# Patient Record
Sex: Female | Born: 1960 | Race: Black or African American | Hispanic: No | Marital: Married | Smoking: Former smoker
Health system: Southern US, Community
[De-identification: ages and names within clinical notes are randomized; demographics above are authoritative.]

## PROBLEM LIST (undated history)

## (undated) DIAGNOSIS — G459 Transient cerebral ischemic attack, unspecified: Secondary | ICD-10-CM

## (undated) DIAGNOSIS — E785 Hyperlipidemia, unspecified: Secondary | ICD-10-CM

## (undated) DIAGNOSIS — I10 Essential (primary) hypertension: Secondary | ICD-10-CM

---

## 2007-04-07 ENCOUNTER — Ambulatory Visit: Payer: Self-pay | Admitting: Internal Medicine

## 2007-12-03 ENCOUNTER — Ambulatory Visit: Payer: Self-pay | Admitting: Family Medicine

## 2010-02-08 ENCOUNTER — Ambulatory Visit: Payer: Self-pay | Admitting: Internal Medicine

## 2011-05-18 ENCOUNTER — Ambulatory Visit: Payer: Self-pay | Admitting: Family Medicine

## 2013-09-28 ENCOUNTER — Ambulatory Visit: Payer: Self-pay | Admitting: Internal Medicine

## 2015-05-24 IMAGING — CR DG KNEE COMPLETE 4+V*R*
4 series · 4 of 4 positions shown · non-contrast
Comparison: None.

CLINICAL DATA: Fall 2 weeks ago, difficulty walking

EXAM:
RIGHT KNEE - COMPLETE 4+ VIEW

[knee ap]
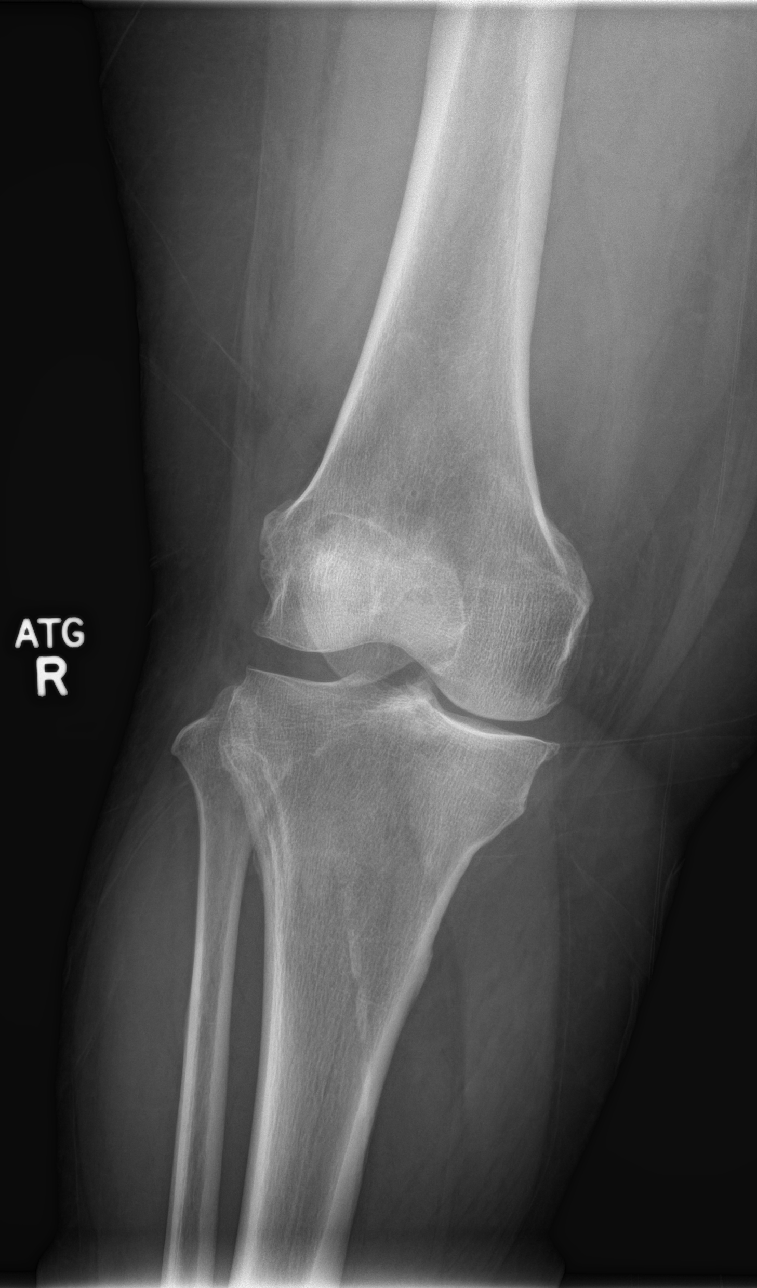

[knee obl (1 of 2)]
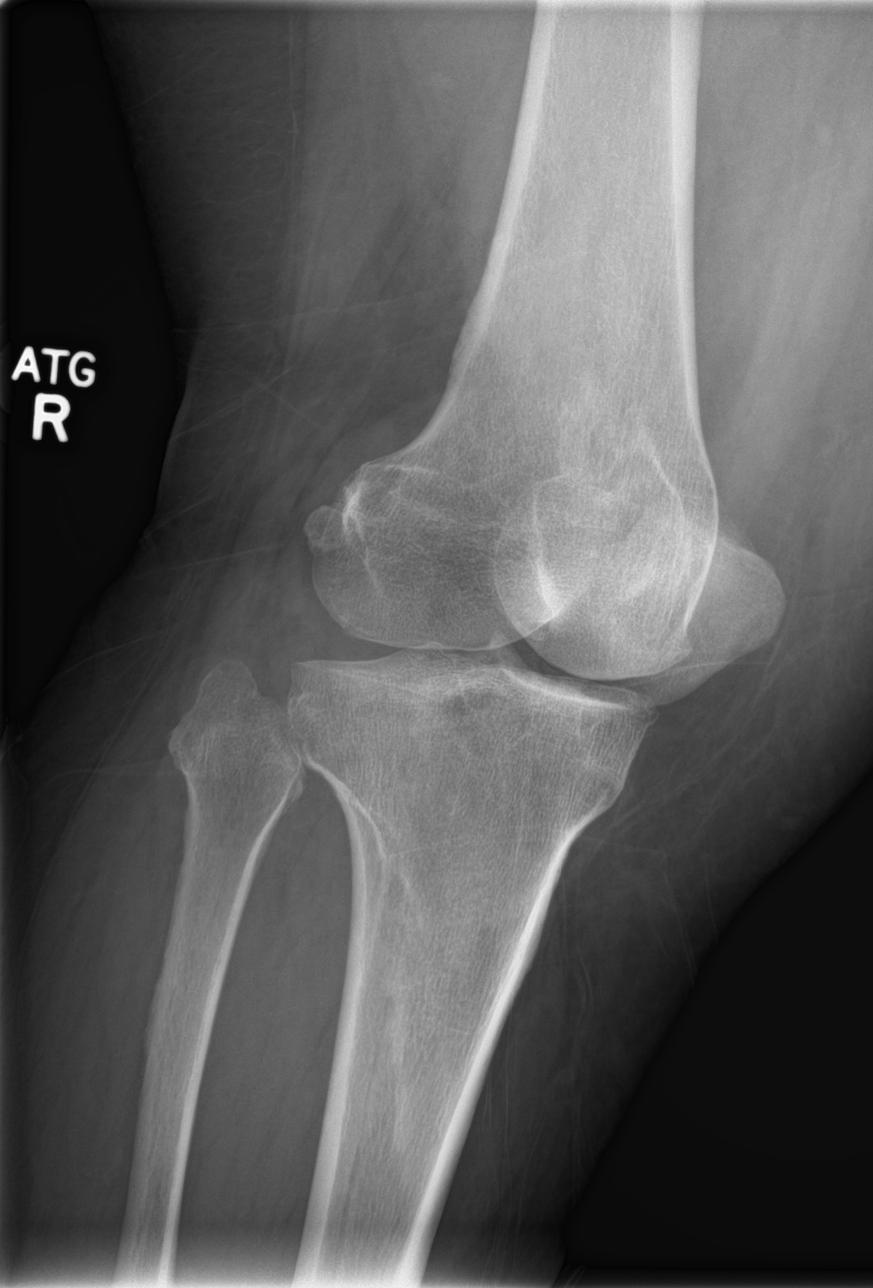

[knee obl (2 of 2)]
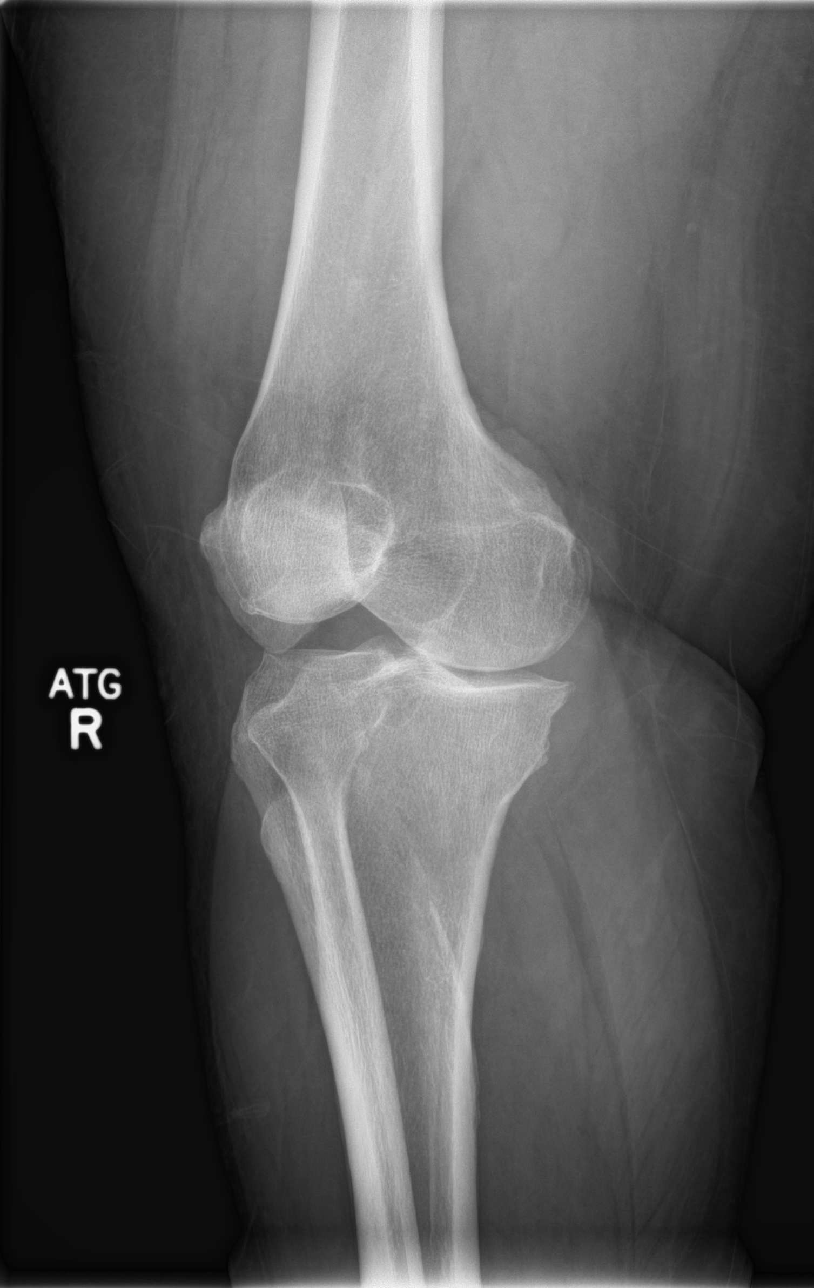

[knee lat]
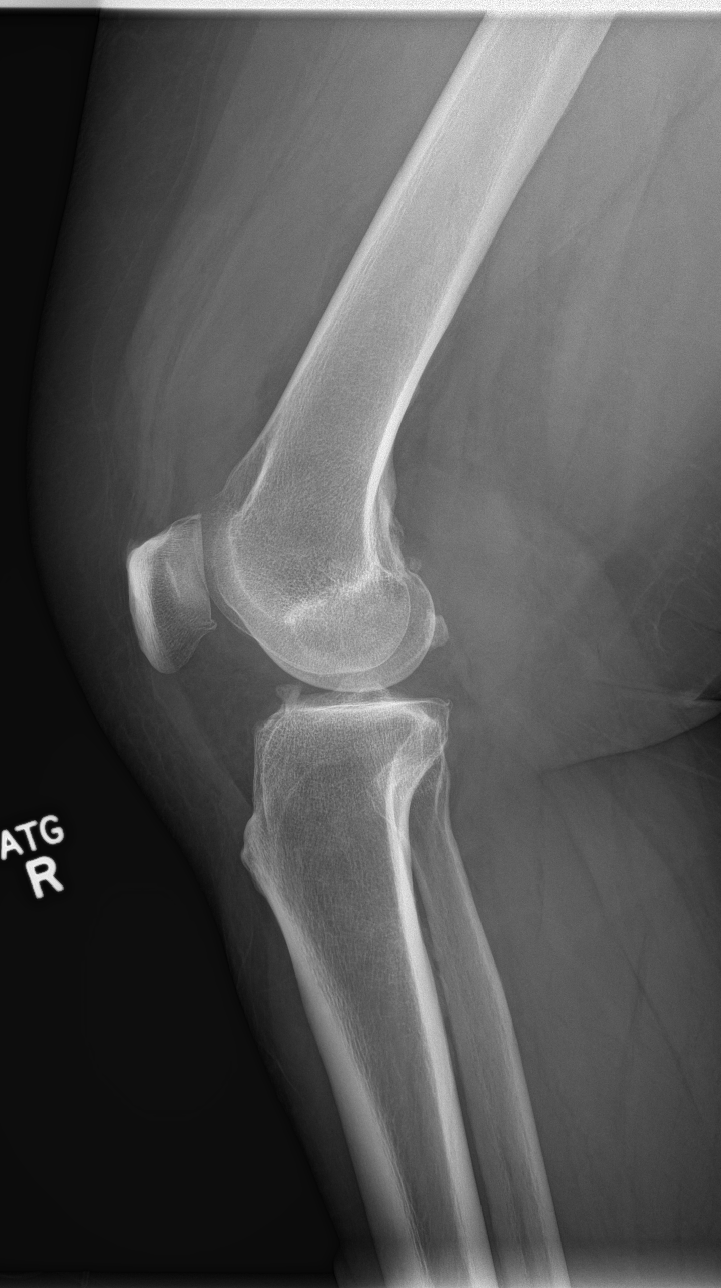

[4 of 4 positions shown; findings below may reference images not displayed]

FINDINGS: Four views of the right knee submitted. No acute fracture or
subluxation. There is narrowing of medial joint compartment. Mild
spurring of femoral condyles. Mild spurring of medial tibial
plateau. Narrowing of patellofemoral joint space. Moderate joint
effusion.
IMPRESSION: No acute fracture or subluxation. Osteoarthritic changes as
described above. Moderate joint effusion.

## 2015-06-14 ENCOUNTER — Other Ambulatory Visit: Payer: Self-pay

## 2016-08-26 ENCOUNTER — Other Ambulatory Visit: Payer: Self-pay | Admitting: Adult Health Nurse Practitioner

## 2016-11-17 ENCOUNTER — Encounter: Payer: Self-pay | Admitting: Emergency Medicine

## 2016-11-17 ENCOUNTER — Ambulatory Visit
Admission: EM | Admit: 2016-11-17 | Discharge: 2016-11-17 | Disposition: A | Discharge status: Home / Self Care | Payer: 59 | Attending: Family Medicine | Admitting: Family Medicine

## 2016-11-17 DIAGNOSIS — L84 Corns and callosities: Secondary | ICD-10-CM

## 2016-11-17 DIAGNOSIS — L03116 Cellulitis of left lower limb: Secondary | ICD-10-CM | POA: Diagnosis not present

## 2016-11-17 HISTORY — DX: Transient cerebral ischemic attack, unspecified: G45.9

## 2016-11-17 HISTORY — DX: Hyperlipidemia, unspecified: E78.5

## 2016-11-17 HISTORY — DX: Essential (primary) hypertension: I10

## 2016-11-17 MED ORDER — CEPHALEXIN 500 MG PO CAPS: 500.0000 mg | ORAL_CAPSULE | Freq: Three times a day (TID) | ORAL | 0 refills | Status: DC

## 2016-11-17 NOTE — ED Provider Notes (Signed)
[facility]    CSN: [id number] Arrival date & time: 11/17/16  1451     History   Chief Complaint Chief Complaint  Patient presents with  . sore on foot    left foot    HPI Michelle Marsh is a 56 y.o. female.   56 yo female with a c/o left foot sore and pain for the past 6 days. States has had a callus for a long time, however now more painful and redness plus slight swelling. States she has an appointment with podiatry later this week. Denies any fevers, chills.    The history is provided by the patient.    Past Medical History:  Diagnosis Date  . Hyperlipidemia   . Hypertension   . TIA (transient ischemic attack)     There are no active problems to display for this patient.   Past Surgical History:  Procedure Laterality Date  . CESAREAN SECTION      OB History    No data available       Home Medications    Prior to Admission medications   Medication Sig Start Date End Date Taking? Authorizing Provider  amLODipine (NORVASC) 10 MG tablet Take 10 mg by mouth daily.   Yes [provider]  atorvastatin (LIPITOR) 40 MG tablet Take 40 mg by mouth daily.   Yes [provider]  chlorthalidone (HYGROTON) 25 MG tablet Take 25 mg by mouth daily.   Yes [provider]  cephALEXin (KEFLEX) 500 MG capsule Take 1 capsule (500 mg total) by mouth 3 (three) times daily. 11/17/16   Michelle Mccallum, MD    Family History Family History  Problem Relation Age of Onset  . Cancer Mother 60       pancreatic  . Hypertension Mother   . Cancer Father        esophageal  . Hypertension Father     Social History Social History  Substance Use Topics  . Smoking status: Current Every Day Smoker    Types: Cigarettes  . Smokeless tobacco: Never Used  . Alcohol use Yes     Comment: socially     Allergies   Patient has no known allergies.   Review of Systems Review of Systems   Physical Exam Triage Vital Signs ED Triage Vitals    Enc Vitals Group     BP 11/17/16 1506 135/70     Pulse Rate 11/17/16 1506 96     Resp 11/17/16 1506 16     Temp 11/17/16 1506 98.2 F (36.8 C)     Temp Source 11/17/16 1506 Oral     SpO2 11/17/16 1506 99 %     Weight 11/17/16 1509 250 lb (113.4 kg)     Height 11/17/16 1509  (1.499 m)     Head Circumference --      Peak Flow --      Pain Score 11/17/16 1510 5     Pain Loc --      Pain Edu? --      Excl. in GC? --    No data found.   Updated Vital Signs BP 135/70 (BP Location: Left Arm)   Pulse 96   Temp 98.2 F (36.8 C) (Oral)   Resp 16   Ht  (1.499 m)   Wt 250 lb (113.4 kg)   SpO2 99%   BMI 50.49 kg/m   Visual Acuity Right Eye Distance:   Left Eye Distance:   Bilateral Distance:  Right Eye Near:   Left Eye Near:    Bilateral Near:     Physical Exam  Constitutional: She appears well-developed and well-nourished. No distress.  Musculoskeletal:       Left foot: There is tenderness (left lateral/plantar skin with thick callus formation and surrounding skin erythema, warmth and tenderness ). There is normal range of motion, no bony tenderness, no swelling, normal capillary refill, no deformity and no laceration.       Feet:  Skin: She is not diaphoretic.  Nursing note and vitals reviewed.    UC Treatments / Results  Labs (all labs ordered are listed, but only abnormal results are displayed) Labs Reviewed - No data to display  EKG  EKG Interpretation None       Radiology No results found.  Procedures Procedures (including critical care time)  Medications Ordered in UC Medications - No data to display   Initial Impression / Assessment and Plan / UC Course  I have reviewed the triage vital signs and the nursing notes.  Pertinent labs & imaging results that were available during my care of the patient were reviewed by me and considered in my medical decision making (see chart for details).       Final Clinical Impressions(s) /  UC Diagnoses   Final diagnoses:  Cellulitis of foot, left  Callus of foot    New Prescriptions New Prescriptions   CEPHALEXIN (KEFLEX) 500 MG CAPSULE    Take 1 capsule (500 mg total) by mouth 3 (three) times daily.   1. diagnosis reviewed with patient 2. rx as per orders above; reviewed possible side effects, interactions, risks and benefits  3. Recommend supportive treatment with elevation, warm compresses 4. Follow-up prn if symptoms worsen or don't improve  Controlled Substance Prescriptions [place] Controlled Substance Registry consulted? Not Applicable   Michelle Mccallum, MD 11/17/16 [phone]

## 2016-11-17 NOTE — ED Triage Notes (Signed)
Patient in today c/o sore on left foot x 3-4 months and worsening 2-3 weeks. Patient is not a diabetic.

## 2017-04-01 ENCOUNTER — Encounter: Payer: Self-pay | Admitting: Emergency Medicine

## 2017-04-01 ENCOUNTER — Ambulatory Visit
Admission: EM | Admit: 2017-04-01 | Discharge: 2017-04-01 | Disposition: A | Discharge status: Home / Self Care | Payer: 59 | Attending: Family Medicine | Admitting: Family Medicine

## 2017-04-01 ENCOUNTER — Other Ambulatory Visit: Payer: Self-pay

## 2017-04-01 DIAGNOSIS — J069 Acute upper respiratory infection, unspecified: Secondary | ICD-10-CM

## 2017-04-01 DIAGNOSIS — H6693 Otitis media, unspecified, bilateral: Secondary | ICD-10-CM

## 2017-04-01 DIAGNOSIS — J029 Acute pharyngitis, unspecified: Secondary | ICD-10-CM | POA: Diagnosis not present

## 2017-04-01 MED ORDER — AMOXICILLIN 875 MG PO TABS: 875.0000 mg | ORAL_TABLET | Freq: Two times a day (BID) | ORAL | 0 refills | Status: DC

## 2017-04-01 NOTE — ED Triage Notes (Signed)
Patient c/o bilateral ear pain and sinus pain and pressure for the past 2 days.

## 2017-04-01 NOTE — ED Provider Notes (Signed)
[facility] ____________________________________________  Time seen: Approximately 1:45 PM  I have reviewed the triage vital signs and the nursing notes.   HISTORY  Chief Complaint Otalgia and Sinus Problem   HPI Michelle Marsh is a 57 y.o. female presenting for evaluation of 2-3 days of runny nose, nasal congestion and bilateral ear pain.  States she believes her ears were bothering her even a few days prior.  States today she started to have a little bit of a cough.  States ear discomfort is moderate.  Denies any chills, body aches or feeling like she had a fever.  Has not taken any over-the-counter medications for the same complaints.  States that she has generalized sinus pressure and congestion sensation.  Continues to eat and drink well.  Denies chest pain or shortness of breath.  Denies recent sickness.  States hearing both her ears feel clogged.  Denies known sick contacts.  States also today mild sore throat.  Denies other aggravating or alleviating factors.  Denies chest pain, shortness of breath, abdominal pain, or rash. Denies recent sickness. Denies recent antibiotic use.   Etheleen Nicks, NP: PCP   Past Medical History:  Diagnosis Date  . Hyperlipidemia   . Hypertension   . TIA (transient ischemic attack)     There are no active problems to display for this patient.   Past Surgical History:  Procedure Laterality Date  . CESAREAN SECTION       No current facility-administered medications for this encounter.   Current Outpatient Medications:  .  amLODipine (NORVASC) 10 MG tablet, Take 10 mg by mouth daily., Disp: , Rfl:  .  atorvastatin (LIPITOR) 40 MG tablet, Take 40 mg by mouth daily., Disp: , Rfl:  .  chlorthalidone (HYGROTON) 25 MG tablet, Take 25 mg by mouth daily., Disp: , Rfl:  .  amoxicillin (AMOXIL) 875 MG tablet, Take 1 tablet (875 mg total) by mouth 2 (two) times daily., Disp: 20 tablet, Rfl: 0  Allergies Augmentin [amoxicillin-pot  clavulanate]  Family History  Problem Relation Age of Onset  . Cancer Mother 60       pancreatic  . Hypertension Mother   . Cancer Father        esophageal  . Hypertension Father     Social History Social History   Tobacco Use  . Smoking status: Current Every Day Smoker    Types: Cigarettes  . Smokeless tobacco: Never Used  Substance Use Topics  . Alcohol use: Yes    Comment: socially  . Drug use: No    Review of Systems Constitutional: No fever/chills Eyes: No visual changes. ENT: As above.  Cardiovascular: Denies chest pain. Respiratory: Denies shortness of breath. Gastrointestinal: No abdominal pain.  No nausea, no vomiting.  No diarrhea.  No constipation. Genitourinary: Negative for dysuria. Musculoskeletal: Negative for back pain. Skin: Negative for rash. Neurological: Negative for headaches, focal weakness or numbness.  10-point ROS otherwise negative.  ____________________________________________   PHYSICAL EXAM:  VITAL SIGNS: ED Triage Vitals  Enc Vitals Group     BP 04/01/17 1255 (!) 148/87     Pulse Rate 04/01/17 1255 100     Resp 04/01/17 1255 16     Temp 04/01/17 1255 98.6 F (37 C)     Temp Source 04/01/17 1255 Oral     SpO2 04/01/17 1255 96 %     Weight 04/01/17 1251 255 lb (115.7 kg)     Height 04/01/17 1251 4\' 11"  (1.499 m)  Head Circumference --      Peak Flow --      Pain Score 04/01/17 1251 8     Pain Loc --      Pain Edu? --      Excl. in GC? --    Constitutional: Alert and oriented. Well appearing and in no acute distress. Eyes: Conjunctivae are normal.  Head: Atraumatic.  Mild bilateral frontal and maxillary sinus tenderness palpation.  No swelling. No erythema.  Ears: Left: Mild tenderness auricle movement, normal canal, moderate erythema and bulging TM.  Right nontender to movement, normal canal, moderate erythema and dull TM.  No mastoid tenderness bilaterally.:   Nose:Nasal congestion with clear rhinorrhea  Mouth/Throat:  Mucous membranes are moist. Mild pharyngeal erythema. No tonsillar swelling or exudate.  Neck: No stridor.  No cervical spine tenderness to palpation. Hematological/Lymphatic/Immunilogical: No cervical lymphadenopathy. Cardiovascular: Normal rate, regular rhythm. Grossly normal heart sounds.  Good peripheral circulation. Respiratory: Normal respiratory effort.  No retractions. No wheezes, rales or rhonchi. Good air movement.  Musculoskeletal: Ambulatory with steady gait. No cervical, thoracic or lumbar tenderness to palpation. Neurologic:  Normal speech and language. No gait instability. Skin:  Skin appears warm, dry and intact. No rash noted. Psychiatric: Mood and affect are normal. Speech and behavior are normal.  ___________________________________________   LABS (all labs ordered are listed, but only abnormal results are displayed)  Labs Reviewed - No data to display   PROCEDURES Procedures    INITIAL IMPRESSION / ASSESSMENT AND PLAN / ED COURSE  Pertinent labs & imaging results that were available during my care of the patient were reviewed by me and considered in my medical decision making (see chart for details).  Well-appearing patient.  No acute distress.  Suspect viral upper respiratory infection.  Patient does note to have bilateral otitis media.  Will treat with oral amoxicillin.  Patient states that she is allergic to Augmentin, further clarifying as she cannot tolerate due to diarrhea, tolerates amoxicillin well in the past.  Encourage rest, fluids, supportive care, over-the-counter cough and decongestant medication such as Coricidin.Discussed indication, risks and benefits of medications with patient.  Discussed follow up with Primary care physician this week. Discussed follow up and return parameters including no resolution or any worsening concerns. Patient verbalized understanding and agreed to plan.   ____________________________________________   FINAL CLINICAL  IMPRESSION(S) / ED DIAGNOSES  Final diagnoses:  Bilateral otitis media, unspecified otitis media type  Upper respiratory tract infection, unspecified type     ED Discharge Orders        Ordered    amoxicillin (AMOXIL) 875 MG tablet  2 times daily     04/01/17 1338       Note: This dictation was prepared with Dragon dictation along with smaller phrase technology. Any transcriptional errors that result from this process are unintentional.         Renford DillsMiller, Lashaya Kienitz, NP 04/01/17 1354

## 2017-04-01 NOTE — Discharge Instructions (Signed)
Take medication as prescribed. Rest. Drink plenty of fluids.  ° °Follow up with your primary care physician this week as needed. Return to Urgent care for new or worsening concerns.  ° °

## 2019-05-13 ENCOUNTER — Ambulatory Visit: Payer: Self-pay | Attending: Internal Medicine

## 2019-05-13 DIAGNOSIS — Z23 Encounter for immunization: Secondary | ICD-10-CM

## 2019-05-13 NOTE — Progress Notes (Signed)
   Covid-19 Vaccination Clinic  Name:  Michelle Marsh    MRN: [id number] DOB: October 12, 1960  05/13/2019  [name] was observed post Covid-19 immunization for 15 minutes without incident. She was provided with Vaccine Information Sheet and instruction to access the V-Safe system.   [name] was instructed to call 911 with any severe reactions post vaccine: Marland Kitchen Difficulty breathing  . Swelling of face and throat  . A fast heartbeat  . A bad rash all over body  . Dizziness and weakness   Immunizations Administered    Name Date Dose VIS Date Route   Moderna COVID-19 Vaccine 05/13/2019  1:08 PM 0.5 mL 01/18/2019 Intramuscular   Manufacturer: Moderna   Lot: [id number]   NDC: 97471-855-01

## 2019-05-30 ENCOUNTER — Ambulatory Visit
Admission: EM | Admit: 2019-05-30 | Discharge: 2019-05-30 | Disposition: A | Discharge status: Home / Self Care | Payer: BC Managed Care – PPO | Attending: Urgent Care | Admitting: Urgent Care

## 2019-05-30 ENCOUNTER — Other Ambulatory Visit: Payer: Self-pay

## 2019-05-30 ENCOUNTER — Encounter: Payer: Self-pay | Admitting: Emergency Medicine

## 2019-05-30 DIAGNOSIS — M5442 Lumbago with sciatica, left side: Secondary | ICD-10-CM

## 2019-05-30 MED ORDER — HYDROCODONE-ACETAMINOPHEN 5-325 MG PO TABS: 1.0000 | ORAL_TABLET | Freq: Two times a day (BID) | ORAL | 0 refills | Status: DC | PRN

## 2019-05-30 MED ORDER — METHYLPREDNISOLONE 4 MG PO TBPK: ORAL_TABLET | ORAL | 0 refills | Status: DC

## 2019-05-30 NOTE — ED Provider Notes (Signed)
Michelle Marsh   Name: Michelle Marsh DOB: [birth year] MRN: [id number] CSN: [id number] PCP: Verita Lamb, NP  Arrival date and time:  05/30/19 1242  Chief Complaint:  Back Pain and Leg Pain (left)  NOTE: Prior to seeing the patient today, I have reviewed the triage nursing documentation and vital signs. Clinical staff has updated patient's PMH/PSHx, current medication list, and drug allergies/intolerances to ensure comprehensive history available to assist in medical decision making.   History:   HPI: Michelle Marsh is a 59 y.o. female who presents today with complaints of lower back pain that began 2-3 days ago. Pain has worsened since onset. She notes that her terminally ill husband suffered a fall and she tried to help get him up out of the floor. Pain is mainly in the LEFT side of her back. She has not experienced any concurrent urinary symptoms. Patient endorses radiation of her pain into her LEFT lower extremity culminating at the level of the posterior knee. Pain is described as a stabbing sensation. She has not appreciated any weakness or distal paraesthesias in her legs. Patient denies any acute issues with bowel or bladder incontinence. No saddle anesthesia. Patient notes that pain is exacerbated by ambulation, prolonged standing, and prolonged sitting. She has attempted conservative management at home using APAP, which has done little to improve her symptoms. PMH does not include any previous back injuries or surgeries.  Past Medical History:  Diagnosis Date  . Hyperlipidemia   . Hypertension   . TIA (transient ischemic attack)     Past Surgical History:  Procedure Laterality Date  . CESAREAN SECTION      Family History  Problem Relation Age of Onset  . Cancer Mother 39       pancreatic  . Hypertension Mother   . Cancer Father        esophageal  . Hypertension Father     Social History   Tobacco Use  . Smoking status: Former Smoker    Types:  Cigarettes  . Smokeless tobacco: Never Used  Substance Use Topics  . Alcohol use: Yes    Comment: socially  . Drug use: No    There are no problems to display for this patient.   Home Medications:    Current Meds  Medication Sig  . amLODipine (NORVASC) 10 MG tablet Take 10 mg by mouth daily.  Marland Kitchen atorvastatin (LIPITOR) 40 MG tablet Take 40 mg by mouth daily.  . chlorthalidone (HYGROTON) 25 MG tablet Take 25 mg by mouth daily.    Allergies:   Augmentin [amoxicillin-pot clavulanate]  Review of Systems (ROS):  Review of systems NEGATIVE unless otherwise noted in narrative H&P section.   Vital Signs: Today's Vitals   05/30/19 1252 05/30/19 1256  BP:  (!) 146/69  Pulse:  72  Resp:  18  Temp:  97.9 F (36.6 C)  TempSrc:  Oral  SpO2:  97%  Weight: 255 lb 1.2 oz (115.7 kg)   Height: 4\' 11"  (1.499 m)   PainSc: 8      Physical Exam: Physical Exam  Constitutional: She is oriented to person, place, and time and well-developed, well-nourished, and in no distress.  HENT:  Head: Normocephalic and atraumatic.  Eyes: Pupils are equal, round, and reactive to light.  Cardiovascular: Normal rate, regular rhythm, normal heart sounds and intact distal pulses.  Pulmonary/Chest: Effort normal and breath sounds normal.  Musculoskeletal:     Lumbar back: Pain and tenderness present. No edema or spasms. Normal  pulse.       Back:     Comments: No midline back pain or gross deformities.   Neurological: She is alert and oriented to person, place, and time. She has normal sensation, normal strength and normal reflexes. She has a normal Straight Leg Raise Test. Gait (2/2 acute pain; limping) abnormal.  Skin: Skin is warm and dry. No rash noted. She is not diaphoretic.  Psychiatric: Memory, affect and judgment normal. Her mood appears anxious.  Nursing note and vitals reviewed.   Urgent Care Treatments / Results:   No orders of the defined types were placed in this encounter.   LABS:  PLEASE NOTE: all labs that were ordered this encounter are listed, however only abnormal results are displayed. Labs Reviewed - No data to display  EKG: -None  RADIOLOGY: No results found.  PROCEDURES: Procedures  MEDICATIONS RECEIVED THIS VISIT: Medications - No data to display  PERTINENT CLINICAL COURSE NOTES/UPDATES:   Initial Impression / Assessment and Plan / Urgent Care Course:  Pertinent labs & imaging results that were available during my care of the patient were personally reviewed by me and considered in my medical decision making (see lab/imaging section of note for values and interpretations).  [name] is a 59 y.o. female who presents to [facility] today with complaints of Back Pain and Leg Pain (left)  Patient is well appearing overall in clinic today. She does not appear to be in any acute distress. Presenting symptoms (see HPI) and exam as documented above. Patient with tenderness overlying the LEFT paraspinous muscle group and SI joint. Pain radiates into the buttock and into the posterior aspect of her leg culminating at the level of the knee. Exam consistent with sciatica. Patient is in significant pain and reports that she is having difficulties sleeping at night. Will pursue treatment using systemic steroids (Medrol). She was educated on complimentary modalities to help with her pain. Patient encouraged to remain physically active and attempt to stretch as tolerated. Patient advised to avoid heavy lifting over the next few days. She  Was advised to apply moist heat TID-QID for at least 15-20 minutes at a time; written information provided on today's AVS. Will also send in a prescription for a short course supply of Norco 5/325 mg tablets for PRN use; indications and side effects reviewed.   Current clinical condition warrants patient being out of work in order to recover from her current injury/illness. She was provided with the appropriate  documentation to provide to her place of employment that will allow for her to RTW on 06/02/2019 with no restrictions.   Discussed follow up with primary care physician in 1 week for re-evaluation. I have reviewed the follow up and strict return precautions for any new or worsening symptoms. Patient is aware of symptoms that would be deemed urgent/emergent, and would thus require further evaluation either here or in the emergency department. At the time of discharge, she verbalized understanding and consent with the discharge plan as it was reviewed with her. All questions were fielded by provider and/or clinic staff prior to patient discharge.    Final Clinical Impressions / Urgent Care Diagnoses:   Final diagnoses:  Acute left-sided low back pain with left-sided sciatica    New Prescriptions:  [place] Controlled Substance Registry consulted? Yes, I have consulted the [place] Controlled Substances Registry for this patient, and feel the risk/benefit ratio today is favorable for proceeding with this prescription for a controlled substance.  . Discussed  use of controlled substance medication to treat her acute pain.  o Reviewed [place] STOP Act regulations  o Clinic does not refill controlled substances over the phone without face to face evaluation.  . Safety precautions reviewed.  o Medications should not be bitten, chewed, sold, or taken with alcohol.  o Avoid use while working, driving, or operating heavy machinery.  o Side effects associated with the use of this particular medication reviewed. - Patient understands that this medication can cause CNS depression, increase her risk of falls, and even lead to overdose that may result in death, if used outside of the parameters that she and I discussed.  With all of this in mind, she knowingly accepts the risks and responsibilities associated with intended course of treatment, and elects to responsibly proceed as discussed.  Meds ordered this encounter   Medications  . methylPREDNISolone (MEDROL DOSEPAK) 4 MG TBPK tablet    Sig: Take by mouth daily - taper daily dose per package instructions.    Dispense:  21 tablet    Refill:  0  . HYDROcodone-acetaminophen (NORCO) 5-325 MG tablet    Sig: Take 1 tablet by mouth 2 (two) times daily as needed for moderate pain.    Dispense:  8 tablet    Refill:  0    Recommended Follow up Care:  Patient encouraged to follow up with the following provider within the specified time frame, or sooner as dictated by the severity of her symptoms. As always, she was instructed that for any urgent/emergent care needs, she should seek care either here or in the emergency department for more immediate evaluation.  Follow-up Information    Etheleen Nicks, NP In 1 week.   Why: General reassessment of symptoms if not improving Contact information: [address] [place] [address] [phone]         NOTE: This note was prepared using Dragon dictation software along with smaller phrase technology. Despite my best ability to proofread, there is the potential that transcriptional errors may still occur from this process, and are completely unintentional.    Verlee Monte, NP 05/30/19 1342

## 2019-05-30 NOTE — ED Triage Notes (Signed)
Pt c/o left leg pain starting at her knee and radiates up into her lower back. Started about 2-3 days ago. She states that she had to help pick up her terminally ill husband after a fall  And has been hurting since.

## 2019-05-30 NOTE — Discharge Instructions (Addendum)
It was very nice seeing you today in clinic. Thank you for entrusting me with your care.   Make efforts to remain active. Stretch as tolerated. Use medications as prescribed. Apply moist heat 3-4 times a day for 15-10 minutes at a time. Avoid heavy lifting. Use pain medication as needed, but be careful as it can make you sleepy; no driving.   Make arrangements to follow up with your regular doctor in 1 week for re-evaluation if not improving.  If your symptoms/condition worsens, please seek follow up care either here or in the ER. Please remember, our [facility] providers are "right here with you" when you need [place].   Again, it was my pleasure to take care of you today. Thank you for choosing our clinic. I hope that you start to feel better quickly.   Quentin Mulling, MSN, APRN, FNP-C, CEN Advanced Practice Provider [facility]

## 2019-06-10 ENCOUNTER — Ambulatory Visit: Payer: BC Managed Care – PPO | Attending: Internal Medicine

## 2019-06-10 DIAGNOSIS — Z23 Encounter for immunization: Secondary | ICD-10-CM

## 2019-06-10 NOTE — Progress Notes (Signed)
   Covid-19 Vaccination Clinic  Name:  Michelle Marsh    MRN: [id number] DOB: 03-25-1960  06/10/2019  [name] was observed post Covid-19 immunization for 15 minutes without incident. She was provided with Vaccine Information Sheet and instruction to access the V-Safe system.   [name] was instructed to call 911 with any severe reactions post vaccine: Marland Kitchen Difficulty breathing  . Swelling of face and throat  . A fast heartbeat  . A bad rash all over body  . Dizziness and weakness   Immunizations Administered    Name Date Dose VIS Date Route   Moderna COVID-19 Vaccine 06/10/2019 12:03 PM 0.5 mL 01/2019 Intramuscular   Manufacturer: Moderna   Lot: [id number]   NDC: 00511-021-11

## 2022-01-23 ENCOUNTER — Ambulatory Visit
Admission: EM | Admit: 2022-01-23 | Discharge: 2022-01-23 | Disposition: A | Discharge status: Home / Self Care | Payer: BC Managed Care – PPO | Attending: Emergency Medicine | Admitting: Emergency Medicine

## 2022-01-23 ENCOUNTER — Ambulatory Visit (INDEPENDENT_AMBULATORY_CARE_PROVIDER_SITE_OTHER): Payer: BC Managed Care – PPO

## 2022-01-23 ENCOUNTER — Encounter: Payer: Self-pay | Admitting: Emergency Medicine

## 2022-01-23 DIAGNOSIS — R509 Fever, unspecified: Secondary | ICD-10-CM | POA: Diagnosis not present

## 2022-01-23 DIAGNOSIS — Z1152 Encounter for screening for COVID-19: Secondary | ICD-10-CM | POA: Diagnosis not present

## 2022-01-23 DIAGNOSIS — R062 Wheezing: Secondary | ICD-10-CM | POA: Diagnosis not present

## 2022-01-23 DIAGNOSIS — R059 Cough, unspecified: Secondary | ICD-10-CM | POA: Diagnosis not present

## 2022-01-23 DIAGNOSIS — J101 Influenza due to other identified influenza virus with other respiratory manifestations: Secondary | ICD-10-CM | POA: Insufficient documentation

## 2022-01-23 DIAGNOSIS — R531 Weakness: Secondary | ICD-10-CM

## 2022-01-23 LAB — RESP PANEL BY RT-PCR (FLU A&B, COVID) ARPGX2
Influenza A by PCR: POSITIVE — AB
Influenza B by PCR: NEGATIVE
SARS Coronavirus 2 by RT PCR: NEGATIVE

## 2022-01-23 MED ORDER — AEROCHAMBER MV MISC: 1 refills | Status: AC

## 2022-01-23 MED ORDER — ALBUTEROL SULFATE HFA 108 (90 BASE) MCG/ACT IN AERS: 1.0000 | INHALATION_SPRAY | RESPIRATORY_TRACT | 0 refills | Status: AC | PRN

## 2022-01-23 MED ORDER — ALBUTEROL SULFATE (2.5 MG/3ML) 0.083% IN NEBU
2.5000 mg | INHALATION_SOLUTION | Freq: Once | RESPIRATORY_TRACT | Status: AC
  Administered 2022-01-23: 2.5 mg via RESPIRATORY_TRACT

## 2022-01-23 MED ORDER — ACETAMINOPHEN 500 MG PO TABS
1000.0000 mg | ORAL_TABLET | Freq: Once | ORAL | Status: AC
  Administered 2022-01-23: 1000 mg via ORAL

## 2022-01-23 MED ORDER — IPRATROPIUM-ALBUTEROL 0.5-2.5 (3) MG/3ML IN SOLN
3.0000 mL | Freq: Once | RESPIRATORY_TRACT | Status: AC
  Administered 2022-01-23: 3 mL via RESPIRATORY_TRACT

## 2022-01-23 MED ORDER — OSELTAMIVIR PHOSPHATE 75 MG PO CAPS: 75.0000 mg | ORAL_CAPSULE | Freq: Two times a day (BID) | ORAL | 0 refills | Status: DC

## 2022-01-23 MED ORDER — IBUPROFEN 600 MG PO TABS: 600.0000 mg | ORAL_TABLET | Freq: Four times a day (QID) | ORAL | 0 refills | Status: AC | PRN

## 2022-01-23 MED ORDER — PROMETHAZINE-DM 6.25-15 MG/5ML PO SYRP: 5.0000 mL | ORAL_SOLUTION | Freq: Four times a day (QID) | ORAL | 0 refills | Status: DC | PRN

## 2022-01-23 NOTE — Discharge Instructions (Signed)
Take 600 mg of ibuprofen with 1000 mg Tylenol 3 times a day as needed for fever, body aches.  2 puffs from your albuterol inhaler using your spacer every 4 hours for 2 days, then every 6 hours for 2 days, then as needed.  You can back off the albuterol if you start to improve sooner.  Promethazine DM for cough.  Finish the Tamiflu, even if you feel better.  Get a pulse oximeter and watch your oxygen saturation.  If it goes consistently below 90%, go to the emergency department.

## 2022-01-23 NOTE — ED Provider Notes (Signed)
HPI  SUBJECTIVE:  Michelle Marsh is a 61 y.o. female who presents with a cough productive of phlegm, fevers Tmax 101.6, body aches, headaches, nasal congestion, rhinorrhea, frontal sinus pain and pressure, postnasal drip, wheezing, shortness of breath with walking and coughing.  No facial swelling, upper dental pain, loss of sense of smell or taste, nausea, vomiting, diarrhea, abdominal pain.  No known COVID, flu, RSV exposure, but she works at a daycare.  She had 3 doses of the COVID-vaccine and this years flu vaccine.  She was on doxycycline for cough in the past 3 months.  No antipyretic in the past 6 hours.  She tried increasing fluids without improvement in her symptoms.  Symptoms are worse with coughing, ambulation.  She states that she is an occasional smoker for the past 25 years, has a history of hypertension, hypercholesterolemia, TIA, bronchitis.  No history of pulmonary disease, diabetes, chronic kidney disease, CHF.  PCP: [facility]    Past Medical History:  Diagnosis Date   Hyperlipidemia    Hypertension    TIA (transient ischemic attack)     Past Surgical History:  Procedure Laterality Date   CESAREAN SECTION      Family History  Problem Relation Age of Onset   Cancer Mother 33       pancreatic   Hypertension Mother    Cancer Father        esophageal   Hypertension Father     Social History   Tobacco Use   Smoking status: Former    Types: Cigarettes   Smokeless tobacco: Never  Vaping Use   Vaping Use: Never used  Substance Use Topics   Alcohol use: Yes    Comment: socially   Drug use: No    No current facility-administered medications for this encounter.  Current Outpatient Medications:    albuterol (VENTOLIN HFA) 108 (90 Base) MCG/ACT inhaler, Inhale 1-2 puffs into the lungs every 4 (four) hours as needed for wheezing or shortness of breath., Disp: 1 each, Rfl: 0   amLODipine (NORVASC) 10 MG tablet, Take 10 mg by mouth daily., Disp: , Rfl:     aspirin EC 81 MG tablet, Take 1 tablet by mouth daily., Disp: , Rfl:    atorvastatin (LIPITOR) 40 MG tablet, Take 40 mg by mouth daily., Disp: , Rfl:    chlorthalidone (HYGROTON) 25 MG tablet, Take 25 mg by mouth daily., Disp: , Rfl:    ibuprofen (ADVIL) 600 MG tablet, Take 1 tablet (600 mg total) by mouth every 6 (six) hours as needed., Disp: 30 tablet, Rfl: 0   oseltamivir (TAMIFLU) 75 MG capsule, Take 1 capsule (75 mg total) by mouth 2 (two) times daily. X 5 days, Disp: 10 capsule, Rfl: 0   promethazine-dextromethorphan (PROMETHAZINE-DM) 6.25-15 MG/5ML syrup, Take 5 mLs by mouth 4 (four) times daily as needed for cough., Disp: 118 mL, Rfl: 0   Spacer/Aero-Holding Chambers (AEROCHAMBER MV) inhaler, Use as instructed, Disp: 1 each, Rfl: 1  Allergies  Allergen Reactions   Augmentin [Amoxicillin-Pot Clavulanate] Other (See Comments)   Meclizine Other (See Comments)    Memory loss, unable to get out of bed  Memory loss, unable to get out of bed  Memory loss, unable to get out of bed     ROS  As noted in HPI.   Physical Exam  BP (!) 156/79 (BP Location: Left Arm)   Pulse (!) 109   Temp 99.4 F (37.4 C)   Resp 20   Ht  4\' 11"  (1.499 m)   Wt 115.7 kg   SpO2 95%   BMI 51.52 kg/m   Constitutional: Well developed, well nourished, no acute distress.  Wet cough. Eyes:  EOMI, conjunctiva normal bilaterally HENT: Normocephalic, atraumatic,mucus membranes moist.  Positive clear nasal congestion.  Normal turbinates.  Positive mild maxillary sinus tenderness.  Unable to visualize oropharynx. Neck: Positive cervical lymphadenopathy Respiratory: Normal inspiratory effort, diffuse rhonchi throughout all lung fields.  Positive lateral chest wall tenderness Cardiovascular: Regular tachycardia, no murmurs rubs or gallops GI: nondistended skin: No rash, skin intact Musculoskeletal: no deformities Neurologic: Alert & oriented x 3, no focal neuro deficits Psychiatric: Speech and behavior  appropriate   ED Course   Medications  acetaminophen (TYLENOL) tablet 1,000 mg (1,000 mg Oral Given 01/23/22 1028)  ipratropium-albuterol (DUONEB) 0.5-2.5 (3) MG/3ML nebulizer solution 3 mL (3 mLs Nebulization Given 01/23/22 1116)  albuterol (PROVENTIL) (2.5 MG/3ML) 0.083% nebulizer solution 2.5 mg (2.5 mg Nebulization Given 01/23/22 1116)    Orders Placed This Encounter  Procedures   Resp Panel by RT-PCR (Flu A&B, Covid) Anterior Nasal Swab    Standing Status:   Standing    Number of Occurrences:   1   DG Chest 2 View    Standing Status:   Standing    Number of Occurrences:   1    Order Specific Question:   Reason for Exam (SYMPTOM  OR DIAGNOSIS REQUIRED)    Answer:   Cough, fever, rule out pneumonia, other acute pulmonary process    No results found for this or any previous visit (from the past 24 hour(s)).  No results found.  ED Clinical Impression  1. Influenza A      ED Assessment/Plan    Patient presents with an acute illness with systemic symptoms of fever and tachycardia.  Initial O2 sats 91-93%.  Patient has fever 101.6 and tachycardia.  She was given ibuprofen.  Giving 5 mg albuterol with 0.5 mg of Atrovent.  Will reevaluate-recheck vitals.  Reviewed imaging independently.  Mild diffuse increased pulmonary interstitium.  Cannot exclude acute viral/atypical respiratory infection.  No other acute cardiopulmonary abnormality see radiology report for full details.  Labs positive for influenza A.  Home with Tamiflu, regularly scheduled albuterol inhaler with a spacer for 4 days, Promethazine DM, Tylenol/ibuprofen.  Patient is to get a pulse oximeter.  Work note for tomorrow.  May return on Monday.  On reevaluation, patient states that she feels better.  Improved air movement.  Loud wheezing throughout all lung fields, continued scattered rhonchi.  As above.  Discussed labs, imaging, MDM, treatment plan, and plan for follow-up with patient. Discussed sn/sx that should  prompt return to the ED. patient agrees with plan.   Meds ordered this encounter  Medications   acetaminophen (TYLENOL) tablet 1,000 mg   ipratropium-albuterol (DUONEB) 0.5-2.5 (3) MG/3ML nebulizer solution 3 mL   albuterol (PROVENTIL) (2.5 MG/3ML) 0.083% nebulizer solution 2.5 mg   oseltamivir (TAMIFLU) 75 MG capsule    Sig: Take 1 capsule (75 mg total) by mouth 2 (two) times daily. X 5 days    Dispense:  10 capsule    Refill:  0   albuterol (VENTOLIN HFA) 108 (90 Base) MCG/ACT inhaler    Sig: Inhale 1-2 puffs into the lungs every 4 (four) hours as needed for wheezing or shortness of breath.    Dispense:  1 each    Refill:  0   Spacer/Aero-Holding Chambers (AEROCHAMBER MV) inhaler    Sig: Use as instructed  Dispense:  1 each    Refill:  1   ibuprofen (ADVIL) 600 MG tablet    Sig: Take 1 tablet (600 mg total) by mouth every 6 (six) hours as needed.    Dispense:  30 tablet    Refill:  0   promethazine-dextromethorphan (PROMETHAZINE-DM) 6.25-15 MG/5ML syrup    Sig: Take 5 mLs by mouth 4 (four) times daily as needed for cough.    Dispense:  118 mL    Refill:  0      *This clinic note was created using [employer]. Therefore, there may be occasional mistakes despite careful proofreading.  ?    Melynda Ripple, MD 01/26/22 1018

## 2022-01-23 NOTE — ED Triage Notes (Signed)
Pt c/o cough, short of breath, fever (101.2), and weakness. Started yesterday.

## 2022-03-17 ENCOUNTER — Ambulatory Visit (INDEPENDENT_AMBULATORY_CARE_PROVIDER_SITE_OTHER): Payer: BC Managed Care – PPO

## 2022-03-17 ENCOUNTER — Encounter: Payer: Self-pay | Admitting: Emergency Medicine

## 2022-03-17 ENCOUNTER — Ambulatory Visit
Admission: EM | Admit: 2022-03-17 | Discharge: 2022-03-17 | Disposition: A | Discharge status: Home / Self Care | Payer: BC Managed Care – PPO | Attending: Physician Assistant | Admitting: Physician Assistant

## 2022-03-17 DIAGNOSIS — R062 Wheezing: Secondary | ICD-10-CM | POA: Insufficient documentation

## 2022-03-17 DIAGNOSIS — U071 COVID-19: Secondary | ICD-10-CM | POA: Diagnosis not present

## 2022-03-17 DIAGNOSIS — R051 Acute cough: Secondary | ICD-10-CM | POA: Insufficient documentation

## 2022-03-17 LAB — BASIC METABOLIC PANEL
Anion gap: 9 (ref 5–15)
BUN: 13 mg/dL (ref 8–23)
CO2: 29 mmol/L (ref 22–32)
Calcium: 9 mg/dL (ref 8.9–10.3)
Chloride: 100 mmol/L (ref 98–111)
Creatinine, Ser: 0.75 mg/dL (ref 0.44–1.00)
GFR, Estimated: >60 mL/min (ref >60)
Glucose, Bld: 102 mg/dL — ABNORMAL HIGH (ref 70–99)
Potassium: 3.7 mmol/L (ref 3.5–5.1)
Sodium: 138 mmol/L (ref 135–145)

## 2022-03-17 LAB — RESP PANEL BY RT-PCR (RSV, FLU A&B, COVID)  RVPGX2
Influenza A by PCR: NEGATIVE
Influenza B by PCR: NEGATIVE
Resp Syncytial Virus by PCR: NEGATIVE
SARS Coronavirus 2 by RT PCR: POSITIVE — AB

## 2022-03-17 MED ORDER — NIRMATRELVIR/RITONAVIR (PAXLOVID)TABLET: 3.0000 | ORAL_TABLET | Freq: Two times a day (BID) | ORAL | 0 refills | Status: DC

## 2022-03-17 MED ORDER — NIRMATRELVIR/RITONAVIR (PAXLOVID)TABLET: 3.0000 | ORAL_TABLET | Freq: Two times a day (BID) | ORAL | 0 refills | Status: AC

## 2022-03-17 MED ORDER — PROMETHAZINE-DM 6.25-15 MG/5ML PO SYRP: 5.0000 mL | ORAL_SOLUTION | Freq: Four times a day (QID) | ORAL | 0 refills | Status: DC | PRN

## 2022-03-17 MED ORDER — NIRMATRELVIR/RITONAVIR (PAXLOVID)TABLET: ORAL_TABLET | ORAL | 0 refills | Status: DC

## 2022-03-17 MED ORDER — PROMETHAZINE-DM 6.25-15 MG/5ML PO SYRP: 5.0000 mL | ORAL_SOLUTION | Freq: Four times a day (QID) | ORAL | 0 refills | Status: AC | PRN

## 2022-03-17 NOTE — ED Triage Notes (Signed)
Pt c/o cough, headache, nasal congestion. Started last night. Denies fever.

## 2022-03-17 NOTE — Discharge Instructions (Signed)
-  You are positive for COVID-19.  I sent antiviral medication to the pharmacy as well as cough medicine.  Increase your rest and fluids. - Use the inhaler as needed for shortness of breath.   -Isolate 5 days from symptom onset and wear mask for 5 days.  Will need to isolate until this Saturday and then wear mask for 5 days. - Increase your rest and fluids. - If you have any uncontrolled fever, weakness or shortness of breath, go to emergency department.

## 2022-03-17 NOTE — ED Provider Notes (Signed)
[facility]    CSN: [id number] Arrival date & time: 03/17/22  1217      History   Chief Complaint Chief Complaint  Patient presents with   Cough   Headache    HPI Michelle Marsh is a 62 y.o. female presenting for onset of fatigue, cough, congestion, wheezing and shortness of breath yesterday.  She also reports headaches, nasal congestion.  Denies fever, sore throat, chest pain, vomiting or diarrhea.  Denies any sick contacts.  Reports that she has taken NSAIDs for her headache but it did not really help.  She reports that all the coughing is caused her to have a headache.  She also reports when she gets into coughing fits she feels short of breath.  She says she has had issues with breathing in the past but she does not have any formal diagnosis of asthma or COPD.  Medical history stemming for hypertension, hyperlipidemia and previous TIA.  HPI  Past Medical History:  Diagnosis Date   Hyperlipidemia    Hypertension    TIA (transient ischemic attack)     There are no problems to display for this patient.   Past Surgical History:  Procedure Laterality Date   CESAREAN SECTION      OB History   No obstetric history on file.      Home Medications    Prior to Admission medications   Medication Sig Start Date End Date Taking? Authorizing Provider  amLODipine (NORVASC) 10 MG tablet Take 10 mg by mouth daily.   Yes [provider]  atorvastatin (LIPITOR) 40 MG tablet Take 40 mg by mouth daily.   Yes [provider]  nirmatrelvir/ritonavir (PAXLOVID) 20 x 150 MG & 10 x 100MG  TABS Take 3 tablets by mouth 2 (two) times daily for 5 days. Patient GFR is 60. Take nirmatrelvir (150 mg) two tablets twice daily for 5 days and ritonavir (100 mg) one tablet twice daily for 5 days. 03/17/22 03/22/22 Yes Danton Clap, PA-C  albuterol (VENTOLIN HFA) 108 (90 Base) MCG/ACT inhaler Inhale 1-2 puffs into the lungs every 4 (four) hours as needed for wheezing  or shortness of breath. 01/23/22   Melynda Ripple, MD  chlorthalidone (HYGROTON) 25 MG tablet Take 25 mg by mouth daily.    [provider]  ibuprofen (ADVIL) 600 MG tablet Take 1 tablet (600 mg total) by mouth every 6 (six) hours as needed. 01/23/22   Melynda Ripple, MD  promethazine-dextromethorphan (PROMETHAZINE-DM) 6.25-15 MG/5ML syrup Take 5 mLs by mouth 4 (four) times daily as needed. 03/17/22   Danton Clap, PA-C  Spacer/Aero-Holding Chambers (AEROCHAMBER MV) inhaler Use as instructed 01/23/22   Melynda Ripple, MD    Family History Family History  Problem Relation Age of Onset   Cancer Mother 21       pancreatic   Hypertension Mother    Cancer Father        esophageal   Hypertension Father     Social History Social History   Tobacco Use   Smoking status: Former    Types: Cigarettes   Smokeless tobacco: Never  Vaping Use   Vaping Use: Never used  Substance Use Topics   Alcohol use: Yes    Comment: socially   Drug use: No     Allergies   Augmentin [amoxicillin-pot clavulanate] and Meclizine   Review of Systems Review of Systems  Constitutional:  Positive for fatigue. Negative for chills, diaphoresis and fever.  HENT:  Positive for congestion  and rhinorrhea. Negative for ear pain, sinus pressure, sinus pain and sore throat.   Respiratory:  Positive for cough, shortness of breath and wheezing.   Gastrointestinal:  Negative for abdominal pain, nausea and vomiting.  Musculoskeletal:  Negative for arthralgias and myalgias.  Skin:  Negative for rash.  Neurological:  Positive for headaches. Negative for weakness.  Hematological:  Negative for adenopathy.     Physical Exam Triage Vital Signs ED Triage Vitals  Enc Vitals Group     BP      Pulse      Resp      Temp      Temp src      SpO2      Weight      Height      Head Circumference      Peak Flow      Pain Score      Pain Loc      Pain Edu?      Excl. in GC?    No data  found.  Updated Vital Signs BP (!) 159/80   Pulse 93   Temp 99.2 F (37.3 C) (Oral)   Resp (!) 24   Ht 4\' 11"  (1.499 m)   Wt 255 lb 1.2 oz (115.7 kg)   SpO2 95%   BMI 51.52 kg/m   Physical Exam Vitals and nursing note reviewed.  Constitutional:      General: She is not in acute distress.    Appearance: Normal appearance. She is obese. She is ill-appearing. She is not toxic-appearing.  HENT:     Head: Normocephalic and atraumatic.     Nose: Congestion present.     Mouth/Throat:     Mouth: Mucous membranes are moist.     Pharynx: Oropharynx is clear. Posterior oropharyngeal erythema present.  Eyes:     General: No scleral icterus.       Right eye: No discharge.        Left eye: No discharge.     Conjunctiva/sclera: Conjunctivae normal.  Cardiovascular:     Rate and Rhythm: Normal rate and regular rhythm.     Heart sounds: Normal heart sounds.  Pulmonary:     Effort: Pulmonary effort is normal. No respiratory distress.     Breath sounds: Wheezing and rhonchi present.  Musculoskeletal:     Cervical back: Neck supple.  Skin:    General: Skin is dry.  Neurological:     General: No focal deficit present.     Mental Status: She is alert. Mental status is at baseline.     Motor: No weakness.     Gait: Gait normal.  Psychiatric:        Mood and Affect: Mood normal.        Behavior: Behavior normal.        Thought Content: Thought content normal.      UC Treatments / Results  Labs (all labs ordered are listed, but only abnormal results are displayed) Labs Reviewed  RESP PANEL BY RT-PCR (RSV, FLU A&B, COVID)  [id number] - Abnormal; Notable for the following components:      Result Value   SARS Coronavirus 2 by RT PCR POSITIVE (*)    All other components within normal limits  BASIC METABOLIC PANEL - Abnormal; Notable for the following components:   Glucose, Bld 102 (*)    All other components within normal limits    EKG   Radiology DG Chest 2 View  Result Date:  03/17/2022 CLINICAL  DATA:  Cough, headache, nasal congestion EXAM: CHEST - 2 VIEW COMPARISON:  01/23/2022 FINDINGS: Redemonstrated elevation of the left hemidiaphragm. Cardiac and mediastinal contours are within normal limits. No focal pulmonary opacity. No pleural effusion or pneumothorax. No acute osseous abnormality. IMPRESSION: No acute cardiopulmonary process. Electronically Signed   By: Wiliam Ke M.D.   On: 03/17/2022 14:39    Procedures Procedures (including critical care time)  Medications Ordered in UC Medications - No data to display  Initial Impression / Assessment and Plan / UC Course  I have reviewed the triage vital signs and the nursing notes.  Pertinent labs & imaging results that were available during my care of the patient were reviewed by me and considered in my medical decision making (see chart for details).   62 year old female presents for fatigue, cough, headaches, congestion, wheezing and shortness of breath that began last night.  Denies fever.  No known sick contacts.  No known exposure to COVID, flu or RSV.  She is afebrile.  She is ill-appearing but nontoxic.  Exam shows nasal congestion, erythema posterior pharynx and diffuse wheezing and rhonchi throughout all lung fields but no respiratory distress noted.  Respiratory panel and chest x-ray obtained.  Respiratory panel positive for COVID-19.  Chest x-ray without acute abnormality.  Would like to prescribe Paxlovid.  BMP obtained to assess kidney function. GFR 60. Sent Paxlovid.  Reviewed current CDC guidelines, isolation protocol and ED precautions with patient in regards to COVID-19.  Encouraged plenty rest and fluids, supportive care.  In addition to the Paxlovid, sent Promethazine DM to pharmacy.  Work note given.   Final Clinical Impressions(s) / UC Diagnoses   Final diagnoses:  COVID-19  Acute cough  Wheezing     Discharge Instructions      -You are positive for COVID-19.  I sent antiviral  medication to the pharmacy as well as cough medicine.  Increase your rest and fluids. - Use the inhaler as needed for shortness of breath.   -Isolate 5 days from symptom onset and wear mask for 5 days.  Will need to isolate until this Saturday and then wear mask for 5 days. - Increase your rest and fluids. - If you have any uncontrolled fever, weakness or shortness of breath, go to emergency department.     ED Prescriptions     Medication Sig Dispense Auth. Provider   nirmatrelvir/ritonavir (PAXLOVID) 20 x 150 MG & 10 x 100MG  TABS  (Status: Discontinued) Take 3 tablets by mouth 2 (two) times daily for 5 days. Patient GFR is 60. Take nirmatrelvir (150 mg) two tablets twice daily for 5 days and ritonavir (100 mg) one tablet twice daily for 5 days. 30 tablet , PA-C   promethazine-dextromethorphan (PROMETHAZINE-DM) 6.25-15 MG/5ML syrup  (Status: Discontinued) Take 5 mLs by mouth 4 (four) times daily as needed. 118 mL 04-[birth year]-1990, PA-C   nirmatrelvir/ritonavir (PAXLOVID) 20 x 150 MG & 10 x 100MG  TABS  (Status: Discontinued) Patient GFR is 60. Take nirmatrelvir (150 mg) two tablets twice daily for 5 days and ritonavir (100 mg) one tablet twice daily for 5 days. 30 tablet Shirlee Latch, PA-C   promethazine-dextromethorphan (PROMETHAZINE-DM) 6.25-15 MG/5ML syrup Take 5 mLs by mouth 4 (four) times daily as needed. 118 mL Shirlee Latch, PA-C   nirmatrelvir/ritonavir (PAXLOVID) 20 x 150 MG & 10 x 100MG  TABS Take 3 tablets by mouth 2 (two) times daily for 5 days. Patient GFR is 60. Take nirmatrelvir (150 mg) two  tablets twice daily for 5 days and ritonavir (100 mg) one tablet twice daily for 5 days. 30 tablet Gretta Cool      PDMP not reviewed this encounter.   Danton Clap, PA-C 03/17/22 1537
